# Patient Record
Sex: Female | Born: 1970 | Race: White | Hispanic: No
Health system: Midwestern US, Community
[De-identification: ages and names within clinical notes are randomized; demographics above are authoritative.]

## PROBLEM LIST (undated history)

## (undated) HISTORY — PX: ABDOMINAL HYSTERECTOMY: SHX81

## (undated) HISTORY — PX: TONSILLECTOMY: SUR1361

## (undated) HISTORY — PX: CHOLECYSTECTOMY: SHX55

---

## 2007-12-29 ENCOUNTER — Inpatient Hospital Stay: Admit: 2007-12-29 | Discharge: 2007-12-30

## 2017-07-14 NOTE — Procedures (Signed)
Breast Imaging - Right Ultrasound Biopsy        Patient:   [facility]BROWN[facility]            MRN: [id number]            FIN: [phone]               Age:   [age] years     Sex:  Female     DOB:  12/13/1970   Associated Diagnoses:   None   Author:   Dan MakerAHBAR-MD,  Lylla Eifler          Imaging procedure and order verification safety checklist completed: Yes  Allergies documented and available in allergy section of the EMR.    Procedure performed:   Right breast ultrasound guided biopsy    Target:   [x]  Mass   [ ]  Cyst    Puncture Site:   [ ]  Medial   [x]  Lateral   [ ]  Superior   [ ]  Inferior    Medications:   [x]  1% Lidocaine subcutaneous   [x]  1% Lidocaine with epinephrine    Specimens:   [x]  Core samples   [ ]  Cyst fluid    Estimated Blood Loss:   None.    Postoperative Diagnosis:   [ ]  Cyst aspiration performed   [x]  US core biopsy performed      Disposition:  Home    Comments: None.      Signature Line     Electronically Signed on 07/17/2017 11:17 AM EDT   ________________________________________________   Dan MakerAHBAR-MD, Sherril Heyward

## 2018-06-28 ENCOUNTER — Other Ambulatory Visit: Payer: Self-pay

## 2018-06-28 ENCOUNTER — Emergency Department (HOSPITAL_COMMUNITY): Payer: Self-pay

## 2018-06-28 ENCOUNTER — Encounter (HOSPITAL_COMMUNITY): Payer: Self-pay | Admitting: Emergency Medicine

## 2018-06-28 ENCOUNTER — Emergency Department (HOSPITAL_COMMUNITY)
Admission: EM | Admit: 2018-06-28 | Discharge: 2018-06-28 | Disposition: A | Discharge status: Home / Self Care | Payer: Self-pay | Attending: Emergency Medicine | Admitting: Emergency Medicine

## 2018-06-28 DIAGNOSIS — R1010 Upper abdominal pain, unspecified: Secondary | ICD-10-CM | POA: Insufficient documentation

## 2018-06-28 DIAGNOSIS — Z79899 Other long term (current) drug therapy: Secondary | ICD-10-CM | POA: Insufficient documentation

## 2018-06-28 DIAGNOSIS — R197 Diarrhea, unspecified: Secondary | ICD-10-CM | POA: Insufficient documentation

## 2018-06-28 DIAGNOSIS — F17210 Nicotine dependence, cigarettes, uncomplicated: Secondary | ICD-10-CM | POA: Insufficient documentation

## 2018-06-28 DIAGNOSIS — R112 Nausea with vomiting, unspecified: Secondary | ICD-10-CM | POA: Insufficient documentation

## 2018-06-28 LAB — COMPREHENSIVE METABOLIC PANEL
ALT: 19 U/L (ref 0–44)
AST: 21 U/L (ref 15–41)
Albumin: 4.6 g/dL (ref 3.5–5.0)
Alkaline Phosphatase: 70 U/L (ref 38–126)
Anion gap: 13 (ref 5–15)
BUN: 18 mg/dL (ref 6–20)
CO2: 21 mmol/L — ABNORMAL LOW (ref 22–32)
Calcium: 9.5 mg/dL (ref 8.9–10.3)
Chloride: 106 mmol/L (ref 98–111)
Creatinine, Ser: 0.77 mg/dL (ref 0.44–1.00)
GFR calc Af Amer: >60 mL/min (ref >60)
GFR calc non Af Amer: >60 mL/min (ref >60)
Glucose, Bld: 145 mg/dL — ABNORMAL HIGH (ref 70–99)
Potassium: 3.9 mmol/L (ref 3.5–5.1)
Sodium: 140 mmol/L (ref 135–145)
Total Bilirubin: 0.6 mg/dL (ref 0.3–1.2)
Total Protein: 7.4 g/dL (ref 6.5–8.1)

## 2018-06-28 LAB — LIPASE, BLOOD: Lipase: 27 U/L (ref 11–51)

## 2018-06-28 LAB — CBC WITH DIFFERENTIAL/PLATELET
Abs Immature Granulocytes: 0.03 10*3/uL (ref 0.00–0.07)
Basophils Absolute: 0 10*3/uL (ref 0.0–0.1)
Basophils Relative: 0 %
Eosinophils Absolute: 0 10*3/uL (ref 0.0–0.5)
Eosinophils Relative: 0 %
HCT: 43.6 % (ref 36.0–46.0)
Hemoglobin: 14.9 g/dL (ref 12.0–15.0)
Immature Granulocytes: 0 %
Lymphocytes Relative: 7 %
Lymphs Abs: 0.8 10*3/uL (ref 0.7–4.0)
MCH: 32.5 pg (ref 26.0–34.0)
MCHC: 34.2 g/dL (ref 30.0–36.0)
MCV: 95.2 fL (ref 80.0–100.0)
Monocytes Absolute: 0.2 10*3/uL (ref 0.1–1.0)
Monocytes Relative: 2 %
Neutro Abs: 9.3 10*3/uL — ABNORMAL HIGH (ref 1.7–7.7)
Neutrophils Relative %: 91 %
Platelets: 328 10*3/uL (ref 150–400)
RBC: 4.58 MIL/uL (ref 3.87–5.11)
RDW: 13 % (ref 11.5–15.5)
WBC: 10.3 10*3/uL (ref 4.0–10.5)
nRBC: 0 % (ref 0.0–0.2)

## 2018-06-28 LAB — URINALYSIS, ROUTINE W REFLEX MICROSCOPIC
Bilirubin Urine: NEGATIVE
Glucose, UA: NEGATIVE mg/dL
Ketones, ur: 80 mg/dL — AB
Leukocytes,Ua: NEGATIVE
Nitrite: NEGATIVE
Protein, ur: 30 mg/dL — AB
Specific Gravity, Urine: 1.026 (ref 1.005–1.030)
pH: 6 (ref 5.0–8.0)

## 2018-06-28 MED ORDER — IOHEXOL 300 MG/ML  SOLN
100.0000 mL | Freq: Once | INTRAMUSCULAR | Status: AC | PRN
  Administered 2018-06-28: 100 mL via INTRAVENOUS

## 2018-06-28 MED ORDER — SODIUM CHLORIDE 0.9 % IV BOLUS
1000.0000 mL | Freq: Once | INTRAVENOUS | Status: AC
  Administered 2018-06-28: 18:00:00 1000 mL via INTRAVENOUS

## 2018-06-28 MED ORDER — MORPHINE SULFATE (PF) 4 MG/ML IV SOLN
4.0000 mg | Freq: Once | INTRAVENOUS | Status: AC
  Administered 2018-06-28: 20:00:00 4 mg via INTRAVENOUS
  Filled 2018-06-28: qty 1

## 2018-06-28 MED ORDER — PROMETHAZINE HCL 25 MG/ML IJ SOLN
12.5000 mg | Freq: Once | INTRAMUSCULAR | Status: AC
  Administered 2018-06-28: 18:00:00 12.5 mg via INTRAVENOUS
  Filled 2018-06-28: qty 1

## 2018-06-28 NOTE — ED Provider Notes (Signed)
[facility] EMERGENCY DEPARTMENT Provider Note   CSN: [id number] Arrival date & time: 06/28/18  1725    History   Chief Complaint No chief complaint on file. emesis  HPI Debra Stewart is a 48 y.o. female with no significant past medical history presenting with constant upper abdominal pain onset last night. Patient reports symptoms started after having a pizza last night. Patient describes pain as sharp and states it is worse with movement. Patient states she has had multiple episodes of non bilious non bloody vomiting. Patient is unsure of the exact amount. Patient reports she has taken zofran without relief. Patient states she has had multiple episodes of [place] diarrhea. Patient denies any blood in stool. Patient states last BM was 1 hour ago. Patient states vomiting has improved, but continues to have nausea. Patient states she had a hysterectomy and a cholecystectomy. Patient reports she has had similar symptoms in the past. Patient reports occasional alcohol use, but denies any alcohol use today. Patient reports marijuana use and states last use was last night. Patient denies fever, chills, cough, shortness of breath, chest pain, sick contacts, or recent travel. Patient denies dysuria, hematuria, flank pain, or frequency.      HPI  History reviewed. No pertinent past medical history.  There are no active problems to display for this patient.   Past Surgical History:  Procedure Laterality Date   ABDOMINAL HYSTERECTOMY     CHOLECYSTECTOMY     TONSILLECTOMY       OB History   No obstetric history on file.      Home Medications    Prior to Admission medications   Not on File    Family History History reviewed. No pertinent family history.  Social History Social History   Tobacco Use   Smoking status: Current Every Day Smoker    Packs/day: 1.00    Types: Cigarettes   Smokeless tobacco: Never Used  Substance Use Topics   Alcohol use: Yes     Drug use: Yes    Frequency: 7.0 times per week    Types: Marijuana     Allergies   Patient has no known allergies.   Review of Systems Review of Systems  Constitutional: Negative for activity change, appetite change, chills, fever and unexpected weight change.  HENT: Negative for congestion, rhinorrhea and sore throat.   Eyes: Negative for visual disturbance.  Respiratory: Negative for cough and shortness of breath.   Cardiovascular: Negative for chest pain.  Gastrointestinal: Positive for abdominal pain, diarrhea, nausea and vomiting. Negative for constipation.  Endocrine: Negative for polydipsia, polyphagia and polyuria.  Genitourinary: Negative for dysuria, flank pain and frequency.  Musculoskeletal: Negative for back pain.  Skin: Negative for rash.  Allergic/Immunologic: Negative for immunocompromised state.  Psychiatric/Behavioral: The patient is not nervous/anxious.      Physical Exam Updated Vital Signs BP 131/69    Pulse (!) 51    Temp 98.2 F (36.8 C) (Oral)    Resp 16    Ht 5' (1.524 m)    Wt 65.8 kg    SpO2 100%    BMI 28.32 kg/m   Physical Exam Vitals signs and nursing note reviewed.  Constitutional:      General: She is not in acute distress.    Appearance: She is well-developed. She is not diaphoretic.  HENT:     Head: Normocephalic and atraumatic.     Mouth/Throat:     Mouth: Mucous membranes are dry.     Pharynx:  No posterior oropharyngeal erythema.  Neck:     Musculoskeletal: Normal range of motion and neck supple.  Cardiovascular:     Rate and Rhythm: Normal rate and regular rhythm.     Heart sounds: Normal heart sounds. No murmur. No friction rub. No gallop.   Pulmonary:     Effort: Pulmonary effort is normal. No respiratory distress.     Breath sounds: Normal breath sounds. No wheezing or rales.  Abdominal:     General: Bowel sounds are normal. There is no distension.     Palpations: Abdomen is soft. Abdomen is not rigid. There is no mass.      Tenderness: There is abdominal tenderness in the right upper quadrant, epigastric area and left upper quadrant. There is no right CVA tenderness, guarding or rebound.     Hernia: No hernia is present.  Musculoskeletal: Normal range of motion.  Skin:    Findings: No rash.  Neurological:     Mental Status: She is alert and oriented to person, place, and time.     ED Treatments / Results  Labs (all labs ordered are listed, but only abnormal results are displayed) Labs Reviewed  CBC WITH DIFFERENTIAL/PLATELET - Abnormal; Notable for the following components:      Result Value   Neutro Abs 9.3 (*)    All other components within normal limits  COMPREHENSIVE METABOLIC PANEL - Abnormal; Notable for the following components:   CO2 21 (*)    Glucose, Bld 145 (*)    All other components within normal limits  URINALYSIS, ROUTINE W REFLEX MICROSCOPIC - Abnormal; Notable for the following components:   APPearance HAZY (*)    Hgb urine dipstick MODERATE (*)    Ketones, ur 80 (*)    Protein, ur 30 (*)    Bacteria, UA RARE (*)    All other components within normal limits  URINE CULTURE  LIPASE, BLOOD    EKG None  Radiology Ct Abdomen Pelvis W Contrast  Result Date: 06/28/2018 CLINICAL DATA:  Abdominal pain with nausea and vomiting EXAM: CT ABDOMEN AND PELVIS WITH CONTRAST TECHNIQUE: Multidetector CT imaging of the abdomen and pelvis was performed using the standard protocol following bolus administration of intravenous contrast. CONTRAST:  100mL OMNIPAQUE IOHEXOL 300 MG/ML  SOLN COMPARISON:  None. FINDINGS: LOWER CHEST: There is no basilar pleural or apical pericardial effusion. HEPATOBILIARY: The hepatic contours and density are normal. There is no intra- or extrahepatic biliary dilatation. Status post cholecystectomy. PANCREAS: The pancreatic parenchymal contours are normal and there is no ductal dilatation. There is no peripancreatic fluid collection. SPLEEN: Normal. ADRENALS/URINARY  TRACT: --Adrenal glands: Normal. --Right kidney/ureter: No hydronephrosis, nephroureterolithiasis, perinephric stranding or solid renal mass. --Left kidney/ureter: No hydronephrosis, nephroureterolithiasis, perinephric stranding or solid renal mass. --Urinary bladder: Normal for degree of distention STOMACH/BOWEL: --Stomach/Duodenum: There is no hiatal hernia or other gastric abnormality. The duodenal course and caliber are normal. --Small bowel: No dilatation or inflammation. --Colon: No focal abnormality. --Appendix: Normal. VASCULAR/LYMPHATIC: Normal course and caliber of the major abdominal vessels. No abdominal or pelvic lymphadenopathy. REPRODUCTIVE: No free fluid in the pelvis. MUSCULOSKELETAL. 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 OTHER: None. IMPRESSION: No acute abnormality of the or abdomen pelvis. Electronically Signed   By: Deatra RobinsonKevin  Herman M.D.   On: 06/28/2018 20:27    Procedures Procedures (including critical care time)  Medications Ordered in ED Medications  promethazine (PHENERGAN) injection 12.5 mg (12.5 mg Intravenous Given 06/28/18 1806)  sodium chloride 0.9 % bolus 1,000 mL (0 mLs Intravenous Stopped 06/28/18 1945)  morphine 4 MG/ML injection 4 mg (4 mg Intravenous Given 06/28/18 1942)  iohexol (OMNIPAQUE) 300 MG/ML solution 100 mL (100 mLs Intravenous Contrast Given 06/28/18 2003)     Initial Impression / Assessment and Plan / ED Course  I have reviewed the triage vital signs and the nursing notes.  Pertinent labs & imaging results that were available during my care of the patient were reviewed by me and considered in my medical decision making (see chart for details).  Clinical Course as of Jun 27 2041  Thu Jun 28, 2018  1841 WBCs are within normal limits.   WBC: 10.3 [AH]  1841 Lipase is within normal limits.  Lipase, blood [AH]  1841 UA reveals moderate Hgb, ketones, protein, and rare bacteria. Negative for nitrite or  leukocytes.   Urinalysis, Routine w reflex microscopic(!) [AH]  1934 Upon reassessment, patient states nausea and vomiting have improved, but patient continues to endorse abdominal pain. Will order a CT abdomen.   [AH]  2033 No acute abnormality of the abdomen or pelvis.  CT Abdomen Pelvis W Contrast [AH]  2038 Patient reports symptoms have significantly improved. Patient states she feels comfortable with being discharged.    [AH]    Clinical Course User Index [AH] Leretha Dykes, PA-C      Patient presents with abdominal pain. Patient is nontoxic, nonseptic appearing, in no apparent distress.  Patient's symptoms adequately managed in emergency department.  Fluid bolus given.  Labs, imaging and vitals reviewed.  Patient continued to endorse abdominal pain on reassessment. CT abdomen reveals no acute abnormality in the abdomen or pelvis. On repeat exam patient does not have a surgical abdomin and there are no peritoneal signs.  Patient does not meet the SIRS or Sepsis criteria. No indication of appendicitis, bowel obstruction, bowel perforation, cholecystitis, diverticulitis, PID or ectopic pregnancy. Patient reports symptoms have improved while in the ER. Discussed marijuana use may exacerbate nausea and vomiting. Advised patient to stop using marijuana. Patient discharged home with symptomatic treatment and given strict instructions for follow-up with their primary care physician. Patient reports she has antiemetics at home and does not need another prescription at this time. I have also discussed reasons to return immediately to the ER.  Patient expresses understanding and agrees with plan.  Final Clinical Impressions(s) / ED Diagnoses   Final diagnoses:  Upper abdominal pain  Nausea vomiting and diarrhea    ED Discharge Orders    None       Glade Stanford 06/28/18 2043    Jacalyn Lefevre, MD 07/03/18 [phone]

## 2018-06-28 NOTE — ED Triage Notes (Signed)
Reports cholecystectomy 1.5 years ago.  Reports recently eating bad and has had vomiting since last night.  States the only thing that helps her is a "shot of phenergan and morphine".

## 2018-06-28 NOTE — Discharge Instructions (Signed)
You have been seen today for abdominal pain, nausea, vomiting, and diarrhea. Please read and follow all provided instructions.   1. Medications: usual home medications 2. Treatment: rest, drink plenty of fluids 3. Follow Up: Please follow up with your primary doctor in 2 days for discussion of your diagnoses and further evaluation after today's visit; if you do not have a primary care doctor use the resource guide provided to find one; Please return to the ER for any new or worsening symptoms. Please obtain all of your results from medical records or have your doctors office obtain the results - share them with your doctor - you should be seen at your doctors office. Call today to arrange your follow up.   Take medications as prescribed. Please review all of the medicines and only take them if you do not have an allergy to them. Return to the emergency room for worsening condition or new concerning symptoms. Follow up with your regular doctor. If you don't have a regular doctor use one of the numbers below to establish a primary care doctor.  Please be aware that if you are taking birth control pills, taking other prescriptions, ESPECIALLY ANTIBIOTICS may make the birth control ineffective - if this is the case, either do not engage in sexual activity or use alternative methods of birth control such as condoms until you have finished the medicine and your family doctor says it is OK to restart them. If you are on a blood thinner such as COUMADIN, be aware that any other medicine that you take may cause the coumadin to either work too much, or not enough - you should have your coumadin level rechecked in next 7 days if this is the case.  ?  It is also a possibility that you have an allergic reaction to any of the medicines that you have been prescribed - Everybody reacts differently to medications and while MOST people have no trouble with most medicines, you may have a reaction such as nausea, vomiting,  rash, swelling, shortness of breath. If this is the case, please stop taking the medicine immediately and contact your physician.  ?  You should return to the ER if you develop severe or worsening symptoms.   Emergency Department Resource Guide 1) Find a Doctor and Pay Out of Pocket Although you won't have to find out who is covered by your insurance plan, it is a good idea to ask around and get recommendations. You will then need to call the office and see if the doctor you have chosen will accept you as a new patient and what types of options they offer for patients who are self-pay. Some doctors offer discounts or will set up payment plans for their patients who do not have insurance, but you will need to ask so you aren't surprised when you get to your appointment.  2) Contact Your Local Health Department Not all health departments have doctors that can see patients for sick visits, but many do, so it is worth a call to see if yours does. If you don't know where your local health department is, you can check in your phone book. The CDC also has a tool to help you locate your state's health department, and many state websites also have listings of all of their local health departments.  3) Find a Walk-in Clinic If your illness is not likely to be very severe or complicated, you may want to try a walk in clinic. These are  popping up all over the country in pharmacies, drugstores, and shopping centers. They're usually staffed by nurse practitioners or physician assistants that have been trained to treat common illnesses and complaints. They're usually fairly quick and inexpensive. However, if you have serious medical issues or chronic medical problems, these are probably not your best option.  No Primary Care Doctor: Call [facility] Connect at  [phone] - they can help you locate a primary care doctor that  accepts your insurance, provides certain services, etc. Physician Referral Service-  [phone]  Chronic Pain Problems: Organization         Address  Phone   Notes  [facility]  [phone] Patients need to be referred by their primary care doctor.   Medication Assistance: Organization         Address  Phone   Notes  [facility] Medication [facility] [address] [place], [place] [address] [phone] --Must be a resident of [facility] -- Must have NO insurance coverage whatsoever (no Medicaid/ Medicare, etc.) -- The pt. MUST have a primary care doctor that directs their care regularly and follows them in the community   [facility]  [phone]   [employer]  [phone]    Agencies that provide inexpensive medical care: Organization         Address  Phone   Notes  [place]  [phone]   [facility]    [phone]   [facility] [place], [place] [address] [phone]   [place] [address], [place] [phone]   [facility]    [phone]   [facility]    [phone]   [place] [facility] [address], [place] Phone:  [phone], Fax:  [phone] Hours of Operation:  9 am - 6 pm, M-F.  Also accepts Medicaid/Medicare and self-pay.  [facility] for [place] [address], [address], [place] Phone: [phone], Fax: [phone]. Hours of Operation:  8:30 am - 5:30 pm, M-F.  Also accepts Medicaid and self-pay.  [facility] [address] [place] Phone: [phone]   [place], [place], [place] [phone], Ext. [phone] Mondays & Thursdays: 7-9 AM.  First 15 patients are seen on a first come, first serve basis.    [place] Providers:  Organization         Address  Phone   Notes  [facility] [address],  [place] [phone] Also accepts self-pay patients.  [facility] [address], [place]  [phone]   [place], [address], [place] [phone]   [facility] [address] [place] [phone]   Lucianne Lei [address], [place]   [phone] Only accepts [place] patients after they have their name applied to their card.   Self-Pay (no insurance) in [facility]:  Organization         Address  Phone   Notes  Sickle Cell Patients, [facility] [place] [phone]   [facility] [place] [phone]   [facility]  [address] [place]  [address], [place] [phone]   [facility]/Dr. Osei-Bonsu  [address], [place] or [address], [place] [phone] Phone number for both [place] and [place] locations is the same.  [facility] [address], [place] [phone]   [facility] [address] [place] or [address] [phone]   [facility] [address] [place] [phone], phone; [phone], fax Sees patients 1st and 3rd Saturday of every month.  Must not qualify for public or private insurance (i.e. Medicaid, Medicare, [place] Health Choice, Veterans' Benefits)  Household income should be no more than 200% of the poverty level The clinic cannot treat you if you are pregnant or think you are pregnant  Sexually transmitted diseases are not treated at the clinic.

## 2018-08-21 NOTE — ED Notes (Signed)
ED Patient Summary       ;          [facility]  [address], [place], [place] [address]  [id number]  Discharge Instructions (Patient)  _______________________________________     Lisa Spencer  DOB:  Sep 15, 1970                   MRN: [id number]                   FIN: ION%>[id number]  Reason For Visit: Fever; HEADACHE, THROAT AND LUNG PAIN  Final Diagnosis: Fever; Pneumonia; Vomiting     Visit Date: 08/21/2018 13:57:00  Address: [address] [place] [address]  Phone: [phone]     Primary Care Provider:      Name: Louellen Molder      Phone: [phone]        Emergency Department Providers:         Primary Physician:            [facility] would like to thank you for allowing [place] to assist you with your healthcare needs. The following includes patient education materials and information regarding your injury/illness.     Follow-up Instructions: You were treated today on an emergency basis, it may be wise to contact your primary care provider to notify them of your visit today. You may have been referred to your regular doctor or a specialist, please follow up as instructed. If your condition worsens or you can't get in to see the doctor, contact the Emergency Department.              With: Address: When:   NATHAN SHORES-MD [address] [place], [place] [address]  [phone] Business (1) Within 1 week       With: Address: When:   Follow up with primary care provider  Within 1 week              Printed Prescriptions:    Patient Education Materials:  Discharge Orders          Discharge Patient 08/21/18 15:30:00 EDT  Discharge Special Instructions You will be notified by phone if your test result is positive, but it may take up to 5 days. The quickest way to view or print your test result is by the Patient Portal. To begin your Patient Portal enrollment process, please visit [email or web address]  Discharge Special  Instructions Click on the "Sign up now" under [facility]'. If you need additional assistance on the [facility]' Patient Portal or need a copy of your COVID-19 lab test result, please call the [facility] Medical Records Office at [phone].         Comment:       COVID Discharge Instructions/Work Note (Custom) (Custom); Nausea and Vomiting, Adult, Easy-to-Read; Community-Acquired Pneumonia, Adult, Easy-to-Read                      COVID-19 Discharge Instructions  q Your COVID-19 lab test result is PENDING.   Your COVID-19 lab result is not available at this time. Please allow up to 5 days for processing and continue to self-quarantine at home for the next 14 days or unless you have a confirmed negative result (not detected). If your result is positive (detected) you will receive a phone call from [facility] [employer]. Please follow all other discharge instructions given to you by your healthcare provider.  q Your COVID-19 lab test result is POSITIVE.    Your COVID-19 lab result is positive (detected) for the COVID-19 infection. Please follow all other discharge instructions given to you by your healthcare provider.  You must self-quarantine at home for the next 14 days.  Minimize contact with others, including people that live in the same house.      q Your COVID-19 lab test result is NEGATIVE.   Your COVID-19 lab result is negative (not detected) for the COVID-19 infection. Please follow all other discharge instructions given to you by your healthcare provider    q Your COVID-19 antibody lab test is REACTIVE.   Your COVID-19 antibody lab result is reactive to the SARS-CoV-2 antibodies, the virus that causes the COVID-19 infection. A reactive result to the antibody test means that you have most likely had a previous COVID-19 infection but, DOES NOT mean that you have immunity from a future COVID-19 infection. Please follow all other discharge instructions given to you by your healthcare  provider. If you have any additional questions about your reactive antibody result call your 'Primary Care Physician'.     q Your COVID-19 antibody lab test is NON-REACTIVE.   Your COVID-19 antibody lab result is non-reactive to the SARS-CoV-2 antibodies, the virus that causes the COVID-19 infection. A non-reactive result to the antibody test means that you HAVE NOT   had a previous COVID-19 infection. Please follow all other discharge instructions given to you by your healthcare provider. If you have any additional questions about your non-reactive antibody result call your 'Primary Care Physician'.      ****The quickest way to view or print your own test result is by the 'Patient Portal' at [email or web address] see page 3 for further 'Patient Portal' details.    Note: In reviewing your COVID-19 result on the patient portal (under the 'results' tab) you may notice a variation in the test name that includes, "Coronavirus", "cNoV", "Corona", "SARS", or "COV2". These all relate to a COVID-19 test that you had performed at a [facility] location.         What does self-quarantine mean?   Avoid leaving the house unless seeking healthcare treatment. Things that you should NOT do include:  work, school, church, restaurants, and social gatherings   Be very careful not to share utensils, drinking glasses, towels, or bedding with other people   Wash/sanitize your hands frequently and avoid touching your face.   Frequently clean "high-touch" surfaces (e.g. counter tops, doorknobs, bathroom fixtures, phones, tablets, and keyboards).   Try to remain 6 feet away from others as much as possible.   Avoid sharing confined spaces with others as much as possible.  If you must share a confined space, consider wearing a face mask.    How Can I Treat My Symptoms?    With a viral infection, you may experience fever, cough, congestion, headache, sore throat, and body aches.  For symptoms, you may take  Tylenol and/or over-the-counter decongestants and cough medications.  Seek follow-up care if you develop worsening difficulty breathing. If you are returning for care, please call ahead to the ER or your doctor.  You may also see a doctor from home by going to [email or web address].        How to Access the [facility] Patient Portal  1. Go to: [email or web address]  2. You want the option of:  [facility]   If you do not have an account and are visiting the portal for the first  time, select "Sign up now".   If you already have an account, select "Log into [facility]".       3. If you are signing up for a new account you will fill out a "Self-Enrollment for [facility]" page in which you will securely enter your first, last name, date of birth, social security number, and an identity verification prompt. *Tip:  when filling out the "Self-Enrollment for [facility]" enter your name using the same spelling that is on file with your physician's office or hospital. Once this page is filled out, hit the purple "Next" button at the bottom of the page.   4. Verify your information and click the purple "Next Create Your Account" button.   5. After identity verification, you will create your username and password for your "[facility]" Patient Portal. You will then click the green "Create Account" button.   6. Once your account has been created, you will be taken back to a login screen. Log in with the username and password that you created in step 5.     ! If you need additional assistance with the [facility] Patient Portal, you may call the [facility] Records Office at [phone].                 Excuse from Work/School  __________________________________________________   needs to be excused from:    q Work  q School     beginning now and through the following date:   ___________________________.    Health Care Provider Name (printed):    ________________________________________     Health Care Provider (signature):   ____________________________________________    Date: _______________             Nausea and Vomiting    Nausea means you feel sick to your stomach. Throwing up (vomiting) is a reflex where stomach contents come out of your mouth.      HOME CARE     Take medicine as told by your doctor.     Do not force yourself to eat. However, you do need to drink fluids.     If you feel like eating, eat a normal diet as told by your doctor.    ? Eat rice, wheat, potatoes, bread, lean meats, yogurt, fruits, and vegetables.    ? Avoid high-fat foods.     Drink enough fluids to keep your pee (urine) clear or pale yellow.     Ask your doctor how to replace body fluid losses (rehydrate). Signs of body fluid loss (dehydration) include:    ? Feeling very thirsty.    ? Dry lips and mouth.    ? Feeling dizzy.     ? Dark pee.    ? Peeing less than normal.    ? Feeling confused.    ? Fast breathing or heart rate.    GET HELP RIGHT AWAY IF:     You have blood in your throw up.     You have black or bloody poop (stool).     You have a bad headache or stiff neck.     You feel confused.     You have bad belly (abdominal) pain.     You have chest pain or trouble breathing.     You do not pee at least once every 8 hours.     You have cold, clammy skin.     You keep throwing up after 24 to 48 hours.  You have a fever.    MAKE SURE YOU:     Understand these instructions.     Will watch your condition.     Will get help right away if you are not doing well or get worse.    This information is not intended to replace advice given to you by your health care provider. Make sure you discuss any questions you have with your health care provider.    Document Released: 07/13/2007 Document Revised: 04/18/2011 Document Reviewed: 09/30/2014  Elsevier Interactive Patient Education ?2016 Elsevier Inc.       Community-Acquired Pneumonia, Adult    Pneumonia is an infection of the  lungs. One type of pneumonia can happen while a person is in a hospital. A different type can happen when a person is not in a hospital (community-acquired pneumonia). It is easy for this kind to spread from person to person. It can spread to you if you breathe near an infected person who coughs or sneezes. Some symptoms include:     A dry cough.     A wet (productive) cough.     Fever.     Sweating.     Chest pain.      HOME CARE     Take over-the-counter and prescription medicines only as told by your doctor.    ? Only take cough medicine if you are losing sleep.    ? If you were prescribed an antibiotic medicine, take it as told by your doctor. Do not stop taking the antibiotic even if you start to feel better.     Sleep with your head and neck raised (elevated). You can do this by putting a few pillows under your head, or you can sleep in a recliner.     Do not use tobacco products. These include cigarettes, chewing tobacco, and e-cigarettes. If you need help quitting, ask your doctor.     Drink enough water to keep your pee (urine) clear or pale yellow.    A shot (vaccine) can help prevent pneumonia. Shots are often suggested for:     People older than 48 years of age.     People older than 48 years of age:    ? Who are having cancer treatment.    ? Who have long-term (chronic) lung disease.    ? Who have problems with their body's defense system (immune system).    You may also prevent pneumonia if you take these actions:     Get the flu (influenza) shot every year.     Go to the dentist as often as told.     Wash your hands often. If soap and water are not available, use hand sanitizer.    GET HELP IF:     You have a fever.     You lose sleep because your cough medicine does not help.    GET HELP RIGHT AWAY IF:     You are short of breath and it gets worse.     You have more chest pain.     Your sickness gets worse. This is very serious if:    ? You are an older adult.    ? Your body's defense system is  weak.     You cough up blood.    This information is not intended to replace advice given to you by your health care provider. Make sure you discuss any questions you have with your health care provider.  Document Released: 07/13/2007 Document Revised: 10/15/2014 Document Reviewed: 05/21/2014  Elsevier Interactive Patient Education ?2016 Elsevier Inc.         Allergy Info: No Known Allergies     Medication Information:  [facility] ED Physicians provided you with a complete list of medications post discharge, if you have been instructed to stop taking a medication please ensure you also follow up with this information to your Primary Care Physician.  Unless otherwise noted, patient will continue to take medications as prescribed prior to the Emergency Room visit.  Any specific questions regarding your chronic medications and dosages should be discussed with your physician(s) and pharmacist.          promethazine (Phenergan 12.5 mg rectal suppository) 1 Suppositories Per rectum (in the rectum) every 6 hours for 2 Days. Refills: 0.      Medications Administered During Visit:              Medication Dose Route   ondansetron 4 mg Oral   metoclopramide 10 mg Oral   promethazine 6.25 mg IM          Major Tests and Procedures:  The following procedures and tests were performed during your ED visit.  PROCEDURES%>  PROCEDURES COMMENTS%>          Laboratory Orders  Name Status Details   SARS COV2 (BD) Ordered Nasopharyngeal Swab, Stat, ST - Stat, 08/21/18 14:36:00 EDT, Once, 08/21/18 14:36:00 EDT, Nurse collect, Lucillie Garfinkel, Print label Y/N               Radiology Orders  Name Status Details   XR Chest 1 View Portable Completed 08/21/18 14:35:00 EDT, STAT 1 hour or less, Reason: Other abnormalities of breathing, Transport Mode: Portable, pp_set_radiology_subspecialty               Patient Care Orders  Name Status Details   COVID-19 Status Ordered 08/21/18 14:36:30 EDT, NOT VALID FOR  pharmacy, laboratory, radiology., 08/21/18 14:36:30 EDT, COVID-19 PUI - under investigation   Communication to Nursing Ordered 08/21/18 14:36:00 EDT, NOT VALID FOR pharmacy, laboratory, radiology., 08/21/18 14:36:00 EDT, 08/21/18 14:36:00 EDT   Discharge Patient Ordered 08/21/18 15:30:00 EDT   Discharge Special Instructions Ordered You will be notified by phone if your test result is positive, but it may take up to 5 days. The quickest way to view or print your test result is by the Patient Portal. To begin your Patient Portal enrollment process, please visit [email or web address]   Discharge Special Instructions Ordered Click on the "Sign up now" under [facility]'. If you need additional assistance on the [facility]' Patient Portal or need a copy of your COVID-19 lab test result, please call the [facility] Medical Records Office at [phone].   ED Assessment Adult Completed 08/21/18 14:07:51 EDT, 08/21/18 14:07:51 EDT   ED Secondary Triage Completed 08/21/18 14:07:51 EDT, 08/21/18 14:07:51 EDT   ED Triage Adult Completed 08/21/18 13:58:11 EDT, 08/21/18 13:58:11 EDT   Notify Provider Ordered 08/21/18 14:36:30 EDT, This message can only be seen by Nursing, it is not visible to Pharmacy, Laboratory, or Radiology., 08/21/18 14:36:30 EDT   Patient Isolation Ordered 08/21/18 14:36:00 EDT, Contact and Airborne, Constant Indicator       ---------------------------------------------------------------------------------------------------------------------  [facility]) encourages you to self-enroll in the [facility] Patient Portal.  [facility] Patient Portal will allow you to manage your personal health information securely from your own electronic device now and in the future.  To begin your Patient Portal enrollment process, please visit [email or web address] Click on "Sign up now" under [facility].  If you find that you need additional assistance on the [facility] Patient Portal or need a copy of your medical records, please call the [facility] Medical Records Office at [phone].  Comment:

## 2018-08-21 NOTE — ED Notes (Signed)
ED Triage Note       ED Secondary Triage Entered On:  08/21/2018 14:56 EDT    Performed On:  08/21/2018 14:55 EDT by Aline Brochure, RN, Manus Rudd               General Information   Barriers to Learning :   None evident   ED Home Meds Section :   Document assessment   [facility] ED Fall Risk Section :   Document assessment   ED History Section :   Document assessment   ED Advance Directives Section :   Document assessment   ED Palliative Screen :   N/A (prefilled for <65yo)   Lisa Spencer T - 08/21/2018 14:55 EDT   (As Of: 08/21/2018 14:56:12 EDT)   Problems(Active)    No Chronic Problems (Cerner  :NKP )  Name of Problem:   No Chronic Problems ; Recorder:   Aline Brochure RN, Manus Rudd; Code:   NKP ; Last Updated:   08/21/2018 14:56 EDT ; Life Cycle Date:   08/21/2018 ; Life Cycle Status:   Active ; Vocabulary:   Cerner          Diagnoses(Active)    Fever  Date:   08/21/2018 ; Diagnosis Type:   Reason For Visit ; Confirmation:   Complaint of ; Clinical Dx:   Fever ; Classification:   Medical ; Clinical Service:   Emergency medicine ; Code:   PNED ; Probability:   0 ; Diagnosis Code:   [id number]             -    Procedure History   (As Of: 08/21/2018 14:56:12 EDT)     [name] Fall Risk Assessment Tool   Hx of falling last 3 months ED Fall :   No   Patient confused or disoriented ED Fall :   No   Patient intoxicated or sedated ED Fall :   No   Patient impaired gait ED Fall :   No   Use a mobility assistance device ED Fall :   No   Patient altered elimination ED Fall :   No   [facility] ED Fall Score :   0    Lisa Spencer - 08/21/2018 14:55 EDT   ED Advance Directive   Advance Directive :   No   Aline Brochure RN, Manus Rudd - 08/21/2018 14:55 EDT   Social History   Social History   (As Of: 08/21/2018 14:56:12 EDT)   Tobacco:        Tobacco use: Never (less than 100 in lifetime).   (Last Updated: 08/21/2018 14:56:02 EDT by Aline Brochure, RN, Manus Rudd)            Med Hx   Medication List   (As Of: 08/21/2018  14:56:12 EDT)   No Known Home Medications     Lisa Spencer - 08/21/2018 14:55:56      Normal Order    ondansetron 4 mg Dis Tab  :   ondansetron 4 mg Dis Tab ; Status:   Completed ; Ordered As Mnemonic:   Zofran ODT ; Simple Display Line:   4 mg, 1 tabs, Oral, Once ; Ordering Provider:   Jerilynn Som; Catalog Code:   ondansetron ; Order Dt/Tm:   08/21/2018 14:35:53 EDT

## 2018-08-21 NOTE — ED Notes (Signed)
ED Triage Note       ED Triage Adult Entered On:  08/21/2018 14:07 EDT    Performed On:  08/21/2018 14:03 EDT by Donzetta KohutGRAVLEE, RN, MELISSA D               Triage   Chief Complaint :   Pt presents to the ed with complaints of fever, headache, cough and nausea since friday. Last dose of tylenol approx 1 hour pta   Numeric Rating Pain Scale :   6   [place] Mode of Arrival :   Walking   Infectious Disease Documentation :   Document assessment   Temperature Oral :   37.0 degC(Converted to: 98.6 degF)    Heart Rate Monitored :   64 bpm   Respiratory Rate :   20 br/min   Systolic Blood Pressure :   145 mmHg (HI)    Diastolic Blood Pressure :   96 mmHg (HI)    SpO2 :   98 %   Oxygen Therapy :   Room air   Patient presentation :   None of the above   Chief Complaint or Presentation suggest infection :   No   Dosing Weight Obtained By :   Measured   Weight Dosing :   68.7 kg(Converted to: 151 lb 7 oz)    Height :   153 cm(Converted to: 5 ft 0 in)    Body Mass Index Dosing :   29 kg/m2   GRAVLEE, RN, MELISSA D - 08/21/2018 14:03 EDT   Donzetta KohutGRAVLEE, RN, MELISSA D - 08/21/2018 14:03 EDT   DCP GENERIC CODE   Tracking Group :   ED [facility] [place] [employer]   JasperGRAVLEE, RN, [place]SA D - 08/21/2018 14:03 EDT   Tracking Acuity :   3   Romeo AppleHarrison, RN, Hale Droneshley T - 08/21/2018 15:14 EDT     ED General Section :   Document assessment   Pregnancy Status :   Patient denies   ED Allergies Section :   Document assessment   ED Reason for Visit Section :   Document assessment   GRAVLEE, RN, MELISSA D - 08/21/2018 14:03 EDT   ID Risk Screen Symptoms   Recent Travel History :   No recent travel   Close Contact with COVID-19 ID :   No   Last 14 days COVID-19 ID :   No   TB Symptom Screen :   Cough AND Fever OR Chills   C. diff Symptom/History ID :   Neither of the above   GRAVLEE, RN, MELISSA D - 08/21/2018 14:03 EDT   ID TB Screen   Hemoptysis (Blood in Sputum) :   No   Night Sweats :   No   Weight Loss Greater Than 10 Pounds :   No   Hx of TB Now or at Any Time in the  Past :   No   GRAVLEE, RN, MELISSA D - 08/21/2018 14:03 EDT   Allergies   (As Of: 08/21/2018 14:07:50 EDT)   Allergies (Active)   No Known Allergies  Estimated Onset Date:   Unspecified ; Created ByDonzetta Kohut:   GRAVLEE, RN, MELISSA D; Reaction Status:   Active ; Category:   Drug ; Substance:   No Known Allergies ; Type:   Allergy ; Updated By:   Donzetta KohutGRAVLEE, RN, MELISSA D; Reviewed Date:   08/21/2018 14:05 EDT        Psycho-Social   Last 3 mo, thoughts killing self/others :  Patient denies   Feels Unsafe at Home :   No   GRAVLEE, RN, MELISSA D - 08/21/2018 14:03 EDT   ED Reason for Visit   (As Of: 08/21/2018 14:07:50 EDT)   Diagnoses(Active)    Fever  Date:   08/21/2018 ; Diagnosis Type:   Reason For Visit ; Confirmation:   Complaint of ; Clinical Dx:   Fever ; Classification:   Medical ; Clinical Service:   Emergency medicine ; Code:   PNED ; Probability:   0 ; Diagnosis Code:   [id number]

## 2018-08-21 NOTE — ED Provider Notes (Signed)
Nausea-Vomiting *ED        Patient:   Lisa Spencer, [facility]            MRN: [id number]            FIN: [phone]               Age:   48 years     Sex:  Female     DOB:  Jan 04, 1971   Associated Diagnoses:   Pneumonia; Fever; Vomiting   Author:   Lucillie GarfinkelFORRESTER-PA-C,  Deshanae Lindo S      Basic Information   Time seen: Provider Seen (ST)   ED Provider/Time:    Lucillie GarfinkelFORRESTER-PA-C,  Jackelynn Hosie S / 08/21/2018 14:14  , Date & time 08/21/2018 14:15:00.   History source: Patient.   Arrival mode: Private vehicle.   History limitation: None.   Additional information: Chief Complaint from Nursing Triage Note   Chief Complaint  Chief Complaint: Pt presents to the ed with complaints of fever, headache, cough and nausea since friday. Last dose of tylenol approx 1 hour pta (08/21/18 14:03:00).      History of Present Illness   The patient presents with nausea and vomiting.  The onset was 5  days ago.  The course/duration of symptoms is fluctuating in intensity.  The character of symptoms is clear.  The degree at present is minimal.  The exacerbating factor is eating.  There are relieving factors including rest and remaining NPO.  Therapy today: none.  Associated symptoms: fever and Cough, headache.  Additional history: Patient presents for evaluation for subjective fevers, headache, cough, vomiting.  Patient states her symptoms started last Friday.  Patient states she does have a history of recurrent episodes of vomiting that she has had for several years.  Patient states that she vomited prior to my arrival to the room but the patient's emesis bag was empty.  Patient is in no acute distress otherwise at this time..        Review of Systems   Constitutional symptoms:  Fever, chills, generalized weakness.    Respiratory symptoms:  Cough.   Gastrointestinal symptoms:  Vomiting.   Neurologic symptoms:  Headache.             Additional review of systems information: All other systems reviewed and otherwise negative, All systems reviewed as documented in chart.       Health Status   Allergies:    Allergic Reactions (Selected)  No Known Allergies.      Past Medical/ Family/ Social History   Problem list:    Active Problems (1)  No Chronic Problems   , per nurse's notes.      Physical Examination               Vital Signs   Vital Signs   08/21/2018 14:56 EDT Respiratory Rate 20 br/min   08/21/2018 14:03 EDT Systolic Blood Pressure 145 mmHg  HI    Diastolic Blood Pressure 96 mmHg  HI    Temperature Oral 37.0 degC    Heart Rate Monitored 64 bpm    Respiratory Rate 20 br/min    SpO2 98 %   .   Measurements   08/21/2018 14:07 EDT Body Mass Index est meas 29.35 kg/m2    Body Mass Index Measured 29.35 kg/m2   08/21/2018 14:03 EDT Height/Length Measured 153 cm    Weight Dosing 68.7 kg   .   Basic Oxygen Information   08/21/2018 14:03 EDT SpO2 98 %  Oxygen Therapy Room air   .   General:  Alert, no acute distress.    Skin:  Warm, dry.    Head:  Normocephalic.   Neck:  Supple, no tenderness.    Eye:  Pupils are equal, round and reactive to light, extraocular movements are intact.    Ears, nose, mouth and throat:  Oral mucosa moist, no pharyngeal erythema or exudate.    Cardiovascular:  Regular rate and rhythm.   Respiratory:  Lungs are clear to auscultation, respirations are non-labored.    Chest wall:  No tenderness.   Back:  Nontender, Normal range of motion.    Musculoskeletal:  Normal ROM, normal strength.    Gastrointestinal:  Soft, Nontender, Non distended, Normal bowel sounds, No organomegaly.    Neurological:  Alert and oriented to person, place, time, and situation, No focal neurological deficit observed.    Lymphatics:  No lymphadenopathy.   Psychiatric:  Cooperative, appropriate mood & affect.       Medical Decision Making   Differential Diagnosis:  Nausea, vomiting, cough.    Rationale:  PA/NP reviewed with co-signing physician: any ECG, lab results, radiology studies, medication, diagnosis, and plan of care.   Documents reviewed:  Emergency department nurses' notes.   Radiology  results:  Rad Results (ST)   XR Chest 1 View Portable  ?  08/21/18 14:49:13  Chest AP: 08/21/18    INDICATION:Other abnormalities of breathing.    COMPARISON: None    FINDINGS: Subtle somewhat linear opacity is noted at the right lung base, most  likely representing atelectasis but difficult to exclude developing infiltrate.  Clinical correlation is advised. No overt pulmonary edema. No pleural effusion  or pneumothorax. Heart size is within normal limits. No acute osseous  abnormality. Prior rotator cuff repair.    IMPRESSION:    Subtle somewhat linear opacity at the right lung base which likely represents  atelectasis. Difficult to completely exclude developing infiltrate. Clinical  correlation and short interval follow-up recommended.  ?  Signed By: Annamaria HellingMCCARTHY-MD, MEREDITH N  , reviewed radiologist's report.    Notes:  I reviewed patient's past medical record, surgical records, and ED nurses note. , Patient was advised of all test results. Patient is to follow-up with their primary care physician for reevaluation current symptoms if no improvement in one week..       Reexamination/ Reevaluation   Course: improving.   Pain status: decreased.   Assessment: exam improved.   Notes: Patient advised the nurse that she is thrown up 4 times.  The patient's emesis bag is still empty and the patient is only been witnessed spitting.  Patient is requesting Phenergan and stating that that is the only thing that works for her.  I am somewhat concerned that the patient is drug-seeking at this time..      Impression and Plan   Diagnosis   Pneumonia (ICD10-CM J18.9, Discharge, Medical)   Fever (ICD10-CM R50.9, Discharge, Medical)   Vomiting (ICD10-CM R11.10, Discharge, Medical)   Plan   Condition: Improved, Stable.    Disposition: Discharged: to home.    Prescriptions: Launch prescriptions   Pharmacy:  Phenergan 12.5 mg rectal suppository (Prescribe): 12.5 mg, 1 supp, PR, q6hr, for 2 days, 8 supp, 0 Refill(s).    Patient was given  the following educational materials: Community-Acquired Pneumonia, Adult, Easy-to-Read, Nausea and Vomiting, Adult, Easy-to-Read,  COVID Discharge Instructions/Work Note (Custom) (Custom).    Follow up with: Follow up with primary care provider Within 1 week; [facility]  Within 1 week.    Counseled: Patient, Regarding diagnosis, Regarding diagnostic results, Regarding treatment plan, Regarding prescription, Patient indicated understanding of instructions.    Signature Line     Electronically Signed on 08/21/2018 10:52 PM EDT   ________________________________________________   Lucillie Garfinkel      Electronically Signed on 08/21/2018 11:10 PM EDT   ________________________________________________   Tommie Ard            Modified by: Lucillie Garfinkel on 08/21/2018 10:52 PM EDT

## 2018-08-21 NOTE — Discharge Summary (Signed)
ED Clinical Summary                         [facility]  [address]  [place], [place] [id number]  [phone]           PERSON INFORMATION  Name: Lisa Spencer, Lisa Spencer Age:  [age] Years DOB: Dec 19, 1970   Sex: Female Language: English PCP: Lisa Spencer,  Lisa Spencer   Marital Status:  Divorced Phone: [phone] Med Service: MED-Medicine   MRN:  [id number] Acct# [id number] Arrival: 08/21/2018 13:57:00   Visit Reason: Fever; HEADACHE, THROAT AND LUNG PAIN Acuity: 3 LOS: 000 02:27   Address:      [address] [place] [id number]  Diagnosis:      Fever; Pneumonia; Vomiting  Printed Prescriptions:            Allergies      No Known Allergies      Medications Administered During Visit:                  Medication Dose Route   ondansetron 4 mg Oral   metoclopramide 10 mg Oral   promethazine 6.25 mg IM       Patient Medication List:              promethazine (Phenergan 12.5 mg rectal suppository) 1 Suppositories Per rectum (in the rectum) every 6 hours for 2 Days. Refills: 0.         Major Tests and Procedures:  The following procedures and tests were performed during your ED visit.  COMMONPROCEDURES%>  COMMON PROCEDURESCOMMENTS%>          Laboratory Orders  Name Status Details   SARS COV2 (BD) Ordered Nasopharyngeal Swab, Stat, ST - Stat, 08/21/18 14:36:00 EDT, Once, 08/21/18 14:36:00 EDT, Nurse collect, Lucillie GarfinkelFORRESTER-PA-C,  PETER S, Print label Y/N               Radiology Orders  Name Status Details   XR Chest 1 View Portable Completed 08/21/18 14:35:00 EDT, STAT 1 hour or less, Reason: Other abnormalities of breathing, Transport Mode: Portable, pp_set_radiology_subspecialty               Patient Care Orders  Name Status Details   COVID-19 Status Ordered 08/21/18 14:36:30 EDT, NOT VALID FOR pharmacy, laboratory, radiology., 08/21/18 14:36:30 EDT, COVID-19 PUI - under investigation   Communication to Nursing Ordered 08/21/18 14:36:00 EDT, NOT VALID FOR pharmacy, laboratory, radiology.,  08/21/18 14:36:00 EDT, 08/21/18 14:36:00 EDT   Discharge Patient Ordered 08/21/18 15:30:00 EDT   Discharge Special Instructions Ordered You will be notified by phone if your test result is positive, but it may take up to 5 days. The quickest way to view or print your test result is by the Patient Portal. To begin your Patient Portal enrollment process, please visit [email or web address].   Discharge Special Instructions Ordered Click on the "Sign up now" under [facility]'. If you need additional assistance on the [facility]' Patient Portal or need a copy of your COVID-19 lab test result, please call the [facility] Medical Records Office at [phone].   ED Assessment Adult Completed 08/21/18 14:07:51 EDT, 08/21/18 14:07:51 EDT   ED Secondary Triage Completed 08/21/18 14:07:51 EDT, 08/21/18 14:07:51 EDT   ED Triage Adult Completed 08/21/18 13:58:11 EDT, 08/21/18 13:58:11 EDT   Notify Provider Ordered 08/21/18 14:36:30 EDT, This message can only be seen by Nursing, it is not visible to Pharmacy, Laboratory, or  Radiology., 08/21/18 14:36:30 EDT   Patient Isolation Ordered 08/21/18 14:36:00 EDT, Contact and Airborne, Constant Indicator             PROVIDER INFORMATION               Provider Role Assigned Lorelei Pont, RN, Manus Rudd ED Nurse 08/21/2018 14:13:26    Jerilynn Som ED MidLevel 08/21/2018 14:14:05        Attending Physician:  DEFAULT,  DOCTOR     Admit Doc  DEFAULT,  DOCTOR     Consulting Doc       VITALS INFORMATION  Vital Sign Triage Latest   Temp Oral ORAL_1%>37.0 degC ORAL%>37.0 degC   Temp Temporal TEMPORAL_1%> TEMPORAL%>   Temp Intravascular INTRAVASCULAR_1%> INTRAVASCULAR%>   Temp Axillary AXILLARY_1%> AXILLARY%>   Temp Rectal RECTAL_1%> RECTAL%>   02 Sat 98 % 98 %   Respiratory Rate RATE_1%>20 br/min RATE%>20 br/min   Peripheral Pulse Rate PULSE RATE_1%> PULSE RATE%>   Apical Heart Rate HEART RATE_1%> HEART RATE%>   Blood Pressure BLOOD PRESSURE_1%>/ BLOOD PRESSURE_1%>96  mmHg BLOOD PRESSURE%>145 mmHg / BLOOD PRESSURE%>96 mmHg                 Immunizations      No Immunizations Documented This Visit          DISCHARGE INFORMATION   Discharge Disposition: H Outpt-Sent Home   Discharge Location:    Home   Discharge Date and Time:    08/21/2018 16:24:03   ED Checkout Date and Time:    08/21/2018 16:24:03     DEPART REASON INCOMPLETE INFORMATION               Depart Action Incomplete Reason   Interactive View/I&O Recently assessed               Problems      Active           No Chronic Problems              Smoking Status      Never (less than 100 in lifetime)         PATIENT EDUCATION INFORMATION  Instructions:        COVID Discharge Instructions/Work Note (Custom) (Custom); Nausea and Vomiting, Adult, Easy-to-Read; Community-Acquired Pneumonia, Adult, Easy-to-Read     Follow up:                    With: Address: When:   [place] [address]  [phone]) [phone] Business (1) Within 1 week       With: Address: When:   Follow up with primary care provider  Within 1 week           ED PROVIDER DOCUMENTATION

## 2018-08-21 NOTE — Progress Notes (Signed)
Progress Note-Nurse               Unable to reach pt., due to non working number.  We will send letter with results in the mail.      Signature Line     Electronically Signed on 08/26/2018 06:08 PM EDT   ________________________________________________   Still, RN, Melody

## 2018-08-21 NOTE — ED Notes (Signed)
ED Patient Education Note     ;Patient Education Materials Follows:                      COVID-19 Discharge Instructions  q Your COVID-19 lab test result is PENDING.   Your COVID-19 lab result is not available at this time. Please allow up to 5 days for processing and continue to self-quarantine at home for the next 14 days or unless you have a confirmed negative result (not detected). If your result is positive (detected) you will receive a phone call from [facility]. Please follow all other discharge instructions given to you by your healthcare provider.       q Your COVID-19 lab test result is POSITIVE.    Your COVID-19 lab result is positive (detected) for the COVID-19 infection. Please follow all other discharge instructions given to you by your healthcare provider.  You must self-quarantine at home for the next 14 days.  Minimize contact with others, including people that live in the same house.      q Your COVID-19 lab test result is NEGATIVE.   Your COVID-19 lab result is negative (not detected) for the COVID-19 infection. Please follow all other discharge instructions given to you by your healthcare provider    q Your COVID-19 antibody lab test is REACTIVE.   Your COVID-19 antibody lab result is reactive to the SARS-CoV-2 antibodies, the virus that causes the COVID-19 infection. A reactive result to the antibody test means that you have most likely had a previous COVID-19 infection but, DOES NOT mean that you have immunity from a future COVID-19 infection. Please follow all other discharge instructions given to you by your healthcare provider. If you have any additional questions about your reactive antibody result call your 'Primary Care Physician'.     q Your COVID-19 antibody lab test is NON-REACTIVE.   Your COVID-19 antibody lab result is non-reactive to the SARS-CoV-2 antibodies, the virus that causes the COVID-19 infection. A non-reactive result to the antibody test means that you  HAVE NOT   had a previous COVID-19 infection. Please follow all other discharge instructions given to you by your healthcare provider. If you have any additional questions about your non-reactive antibody result call your 'Primary Care Physician'.      ****The quickest way to view or print your own test result is by the 'Patient Portal' at [email or web address] see page 3 for further 'Patient Portal' details.    Note: In reviewing your COVID-19 result on the patient portal (under the 'results' tab) you may notice a variation in the test name that includes, "Coronavirus", "cNoV", "Corona", "SARS", or "COV2". These all relate to a COVID-19 test that you had performed at a [place] location.         What does self-quarantine mean?   Avoid leaving the house unless seeking healthcare treatment. Things that you should NOT do include:  work, school, church, restaurants, and social gatherings   Be very careful not to share utensils, drinking glasses, towels, or bedding with other people   Wash/sanitize your hands frequently and avoid touching your face.   Frequently clean "high-touch" surfaces (e.g. counter tops, doorknobs, bathroom fixtures, phones, tablets, and keyboards).   Try to remain 6 feet away from others as much as possible.   Avoid sharing confined spaces with others as much as possible.  If you must share a confined space, consider wearing a face mask.    How  Can I Treat My Symptoms?    With a viral infection, you may experience fever, cough, congestion, headache, sore throat, and body aches.  For symptoms, you may take Tylenol and/or over-the-counter decongestants and cough medications.  Seek follow-up care if you develop worsening difficulty breathing. If you are returning for care, please call ahead to the ER or your doctor.  You may also see a doctor from home by going to [email or web address].        How to Access the [facility] Patient Portal  1. Go to:  [email or web address]  2. You want the option of:  [facility]   If you do not have an account and are visiting the portal for the first time, select "Sign up now".   If you already have an account, select "Log into [facility]".       3. If you are signing up for a new account you will fill out a "Self-Enrollment for [facility]" page in which you will securely enter your first, last name, date of birth, social security number, and an identity verification prompt. *Tip:  when filling out the "Self-Enrollment for [facility]" enter your name using the same spelling that is on file with your physician's office or hospital. Once this page is filled out, hit the purple "Next" button at the bottom of the page.   4. Verify your information and click the purple "Next Create Your Account" button.   5. After identity verification, you will create your username and password for your "[facility]" Patient Portal. You will then click the green "Create Account" button.   6. Once your account has been created, you will be taken back to a login screen. Log in with the username and password that you created in step 5.     ! If you need additional assistance with the [facility] Patient Portal, you may call the [facility] Medical Records Office at [phone].                 Excuse from Work/School  __________________________________________________   needs to be excused from:    q Work  q School     beginning now and through the following date:   ___________________________.    Health Care Provider Name (printed):   ________________________________________     Health Care Provider (signature):   ____________________________________________    Date: _______________            Easy-to-Read     Nausea and Vomiting    Nausea means you feel sick to your stomach. Throwing up (vomiting) is a reflex where stomach contents come out of your mouth.      HOME CARE     Take medicine as told  by your doctor.     Do not force yourself to eat. However, you do need to drink fluids.     If you feel like eating, eat a normal diet as told by your doctor.    ? Eat rice, wheat, potatoes, bread, lean meats, yogurt, fruits, and vegetables.    ? Avoid high-fat foods.     Drink enough fluids to keep your pee (urine) clear or pale yellow.     Ask your doctor how to replace body fluid losses (rehydrate). Signs of body fluid loss (dehydration) include:    ? Feeling very thirsty.    ? Dry lips and mouth.    ? Feeling dizzy.     ? Dark pee.    ? Peeing  less than normal.    ? Feeling confused.    ? Fast breathing or heart rate.    GET HELP RIGHT AWAY IF:     You have blood in your throw up.     You have black or bloody poop (stool).     You have a bad headache or stiff neck.     You feel confused.     You have bad belly (abdominal) pain.     You have chest pain or trouble breathing.     You do not pee at least once every 8 hours.     You have cold, clammy skin.     You keep throwing up after 24 to 48 hours.     You have a fever.    MAKE SURE YOU:     Understand these instructions.     Will watch your condition.     Will get help right away if you are not doing well or get worse.    This information is not intended to replace advice given to you by your health care provider. Make sure you discuss any questions you have with your health care provider.    Document Released: 07/13/2007 Document Revised: 04/18/2011 Document Reviewed: 09/30/2014  Elsevier Interactive Patient Education ?2016 Elsevier Inc.      Home Health Care     Community-Acquired Pneumonia, Adult    Pneumonia is an infection of the lungs. One type of pneumonia can happen while a person is in a hospital. A different type can happen when a person is not in a hospital (community-acquired pneumonia). It is easy for this kind to spread from person to person. It can spread to you if you breathe near an infected person who coughs or sneezes. Some symptoms include:      A dry cough.     A wet (productive) cough.     Fever.     Sweating.     Chest pain.      HOME CARE     Take over-the-counter and prescription medicines only as told by your doctor.    ? Only take cough medicine if you are losing sleep.    ? If you were prescribed an antibiotic medicine, take it as told by your doctor. Do not stop taking the antibiotic even if you start to feel better.     Sleep with your head and neck raised (elevated). You can do this by putting a few pillows under your head, or you can sleep in a recliner.     Do not use tobacco products. These include cigarettes, chewing tobacco, and e-cigarettes. If you need help quitting, ask your doctor.     Drink enough water to keep your pee (urine) clear or pale yellow.    A shot (vaccine) can help prevent pneumonia. Shots are often suggested for:     People older than 48 years of age.     People older than 48 years of age:    ? Who are having cancer treatment.    ? Who have long-term (chronic) lung disease.    ? Who have problems with their body's defense system (immune system).    You may also prevent pneumonia if you take these actions:     Get the flu (influenza) shot every year.     Go to the dentist as often as told.     Wash your hands often. If soap and water are not available, use hand sanitizer.  GET HELP IF:     You have a fever.     You lose sleep because your cough medicine does not help.    GET HELP RIGHT AWAY IF:     You are short of breath and it gets worse.     You have more chest pain.     Your sickness gets worse. This is very serious if:    ? You are an older adult.    ? Your body's defense system is weak.     You cough up blood.    This information is not intended to replace advice given to you by your health care provider. Make sure you discuss any questions you have with your health care provider.    Document Released: 07/13/2007 Document Revised: 10/15/2014 Document Reviewed: 05/21/2014  Elsevier Interactive Patient Education  ?2016 Elsevier Inc.

## 2018-08-25 LAB — COVID-19: CORONAVIRUS (COVID-19), NAA: NOT DETECTED

## 2020-07-01 IMAGING — CT CT ABDOMEN AND PELVIS WITH CONTRAST
2 of 5 series · 16 of 46 positions shown, 18 images · IV contrast (Omni 300)
Comparison: None.

CLINICAL DATA: Abdominal pain with nausea and vomiting

EXAM:
CT ABDOMEN AND PELVIS WITH CONTRAST
TECHNIQUE: Multidetector CT imaging of the abdomen and pelvis was performed
using the standard protocol following bolus administration of
intravenous contrast.
CONTRAST:  100mL OMNIPAQUE IOHEXOL 300 MG/ML  SOLN

[Series 3: a/p w/ 5mm · axial · 0.76mm/px · z∈[-372,+4]mm · 13 of 85 slices shown, 15 images]
[im 5/85  soft-tissue]
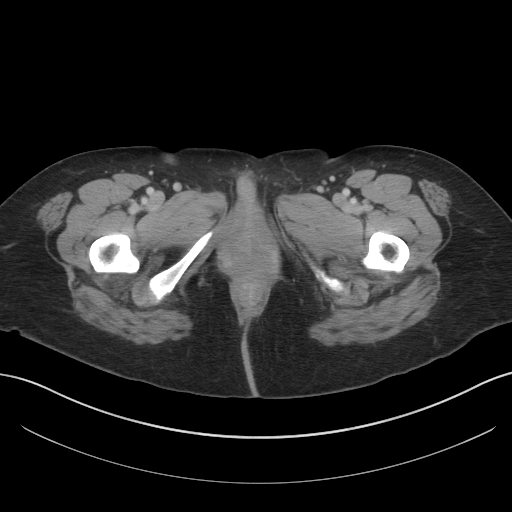
[im 5/85  bone]
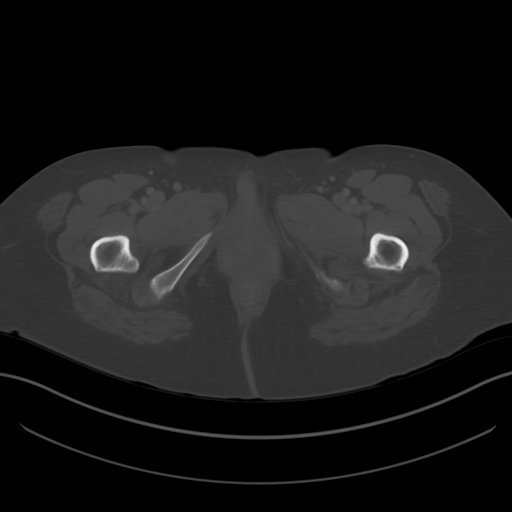
[im 10/85  soft-tissue]
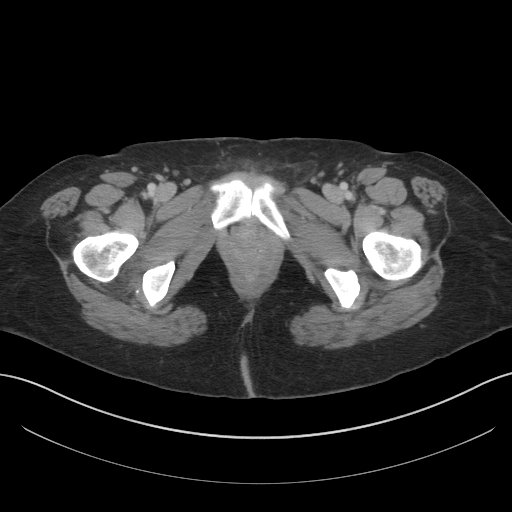
[im 19/85  soft-tissue]
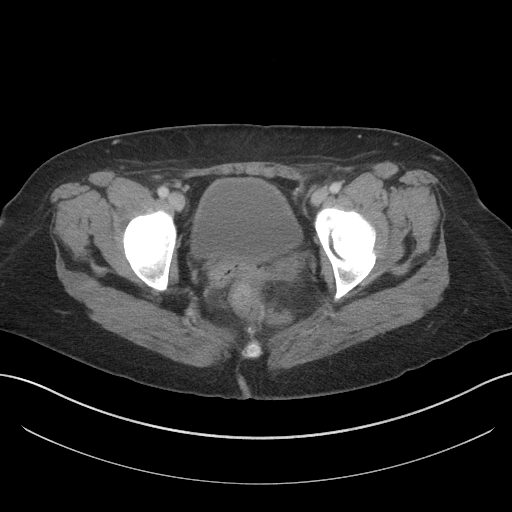
[im 24/85  soft-tissue]
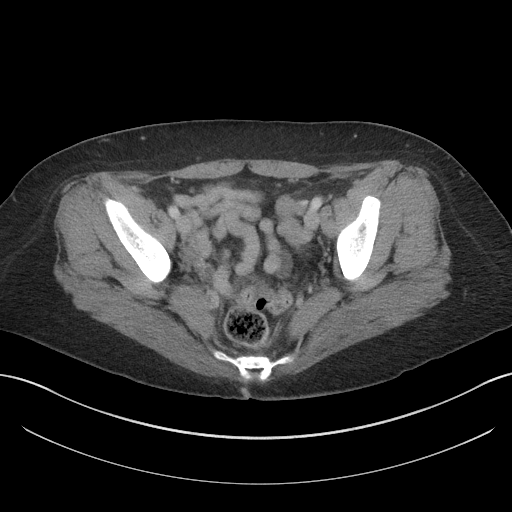
[im 29/85  soft-tissue]
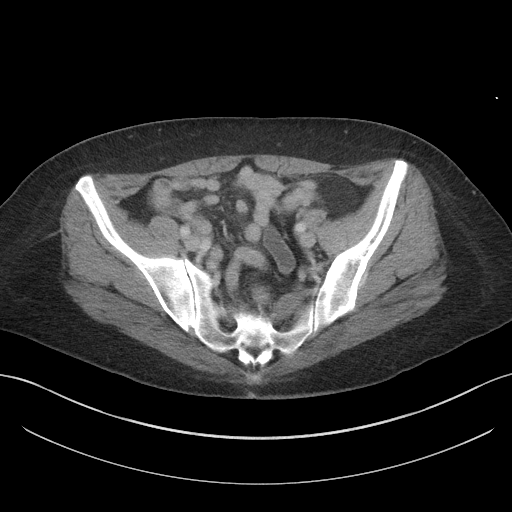
[im 38/85  soft-tissue]
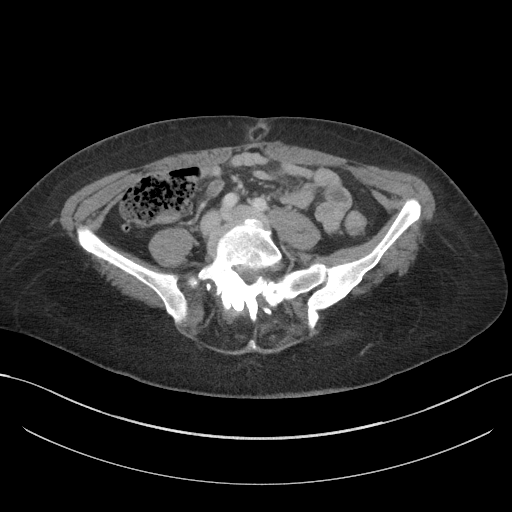
[im 43/85  soft-tissue]
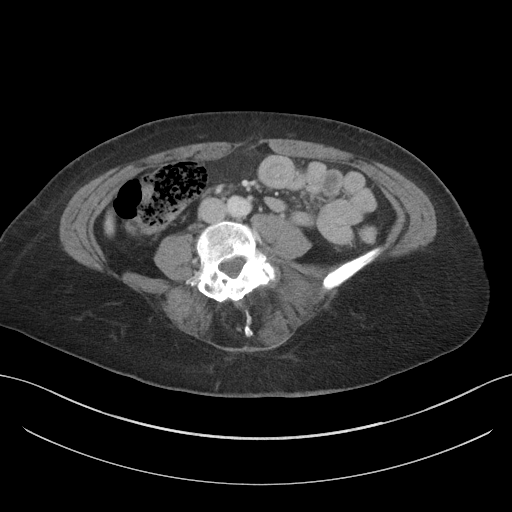
[im 47/85  soft-tissue]
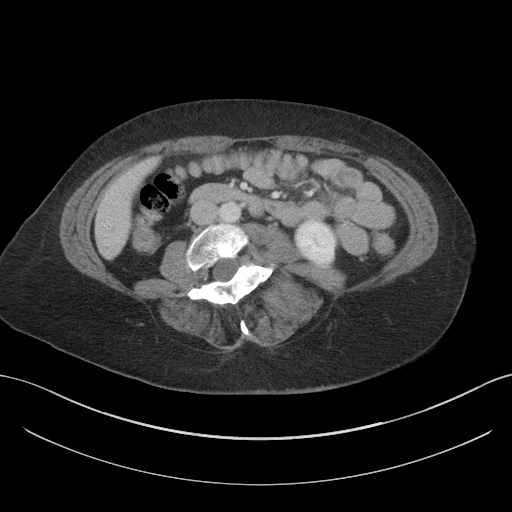
[im 57/85  soft-tissue]
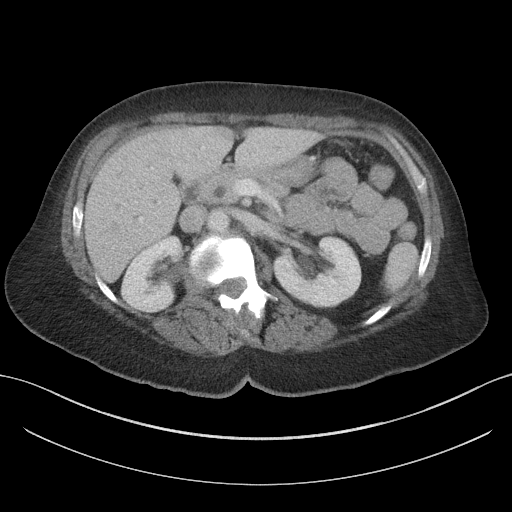
[im 57/85  bone]
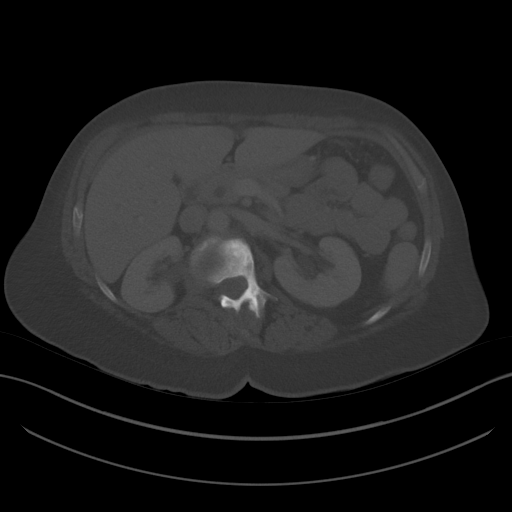
[im 61/85  soft-tissue]
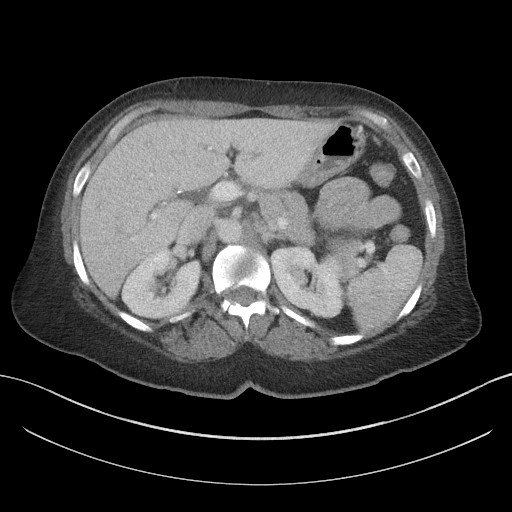
[im 66/85  soft-tissue]
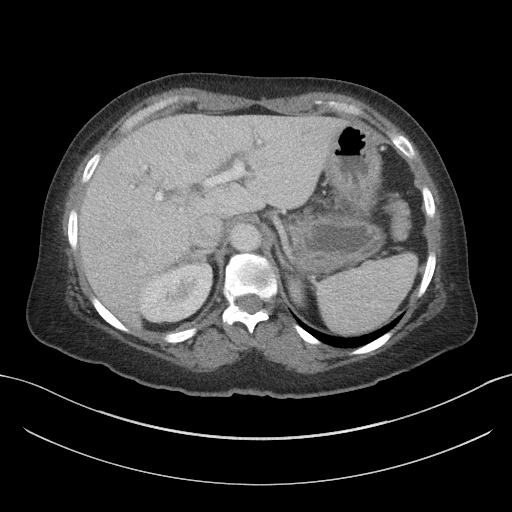
[im 75/85  soft-tissue]
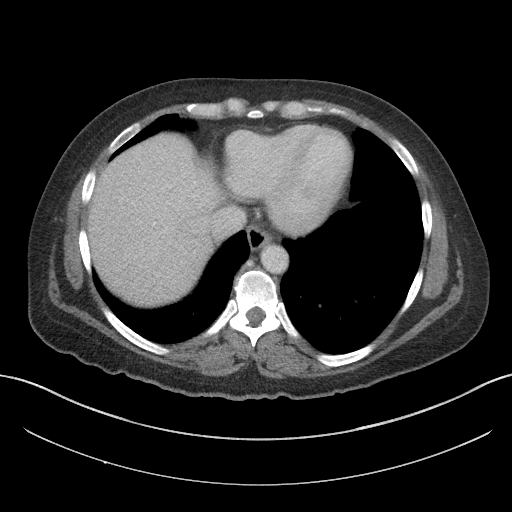
[im 80/85  soft-tissue]
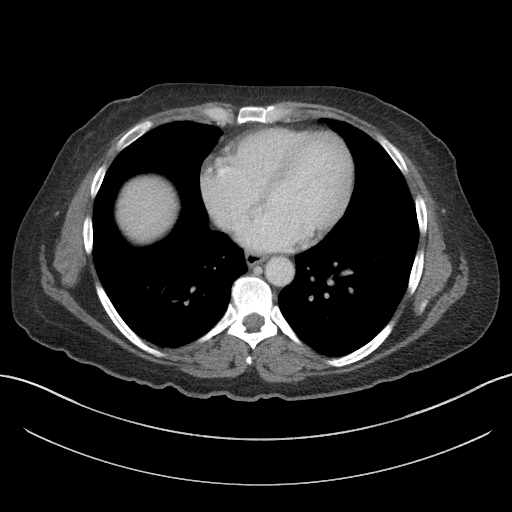

[Series 6: a/p w/ cor · coronal · 0.72mm/px · 3 of 120 slices shown]
[im 40/120  soft-tissue]
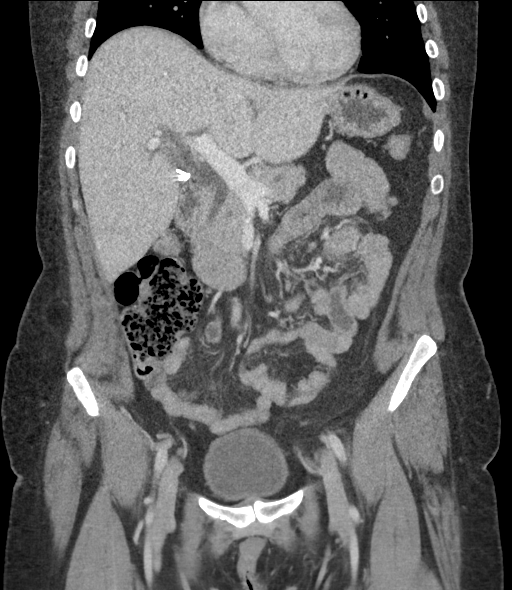
[im 53/120  soft-tissue]
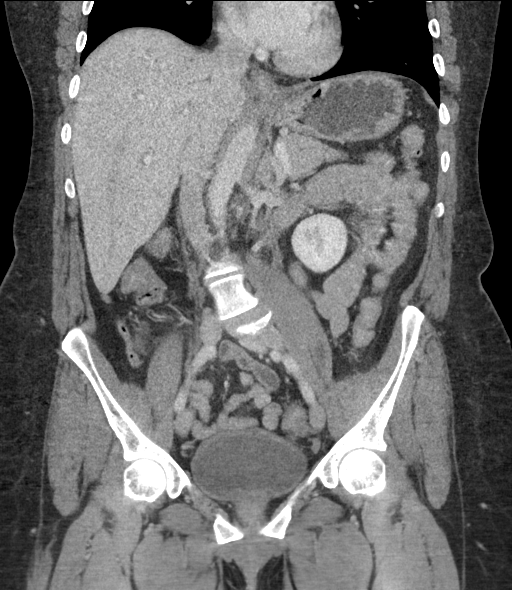
[im 67/120  soft-tissue]
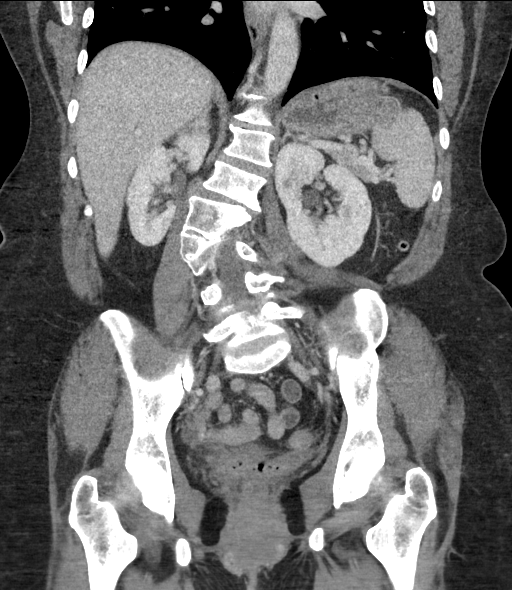

[16 of 46 positions shown; findings below may reference images not displayed]

FINDINGS: LOWER CHEST: There is no basilar pleural or apical pericardial
effusion.

HEPATOBILIARY: The hepatic contours and density are normal. There is
no intra- or extrahepatic biliary dilatation. Status post
cholecystectomy.

PANCREAS: The pancreatic parenchymal contours are normal and there
is no ductal dilatation. There is no peripancreatic fluid
collection.

SPLEEN: Normal.

ADRENALS/URINARY TRACT:

--Adrenal glands: Normal.

--Right kidney/ureter: No hydronephrosis, nephroureterolithiasis,
perinephric stranding or solid renal mass.

--Left kidney/ureter: No hydronephrosis, nephroureterolithiasis,
perinephric stranding or solid renal mass.

--Urinary bladder: Normal for degree of distention

STOMACH/BOWEL:

--Stomach/Duodenum: There is no hiatal hernia or other gastric
abnormality. The duodenal course and caliber are normal.

--Small bowel: No dilatation or inflammation.

--Colon: No focal abnormality.

--Appendix: Normal.

VASCULAR/LYMPHATIC: Normal course and caliber of the major abdominal
vessels. No abdominal or pelvic lymphadenopathy.

REPRODUCTIVE: No free fluid in the pelvis.

MUSCULOSKELETAL. There is right convex lumbar scoliosis with a
segmentation anomaly of the mid lumbar spine.

OTHER: None.
IMPRESSION: No acute abnormality of the or abdomen pelvis.

## 2022-06-20 ENCOUNTER — Encounter: Attending: Obstetrics & Gynecology | Primary: Internal Medicine

## 2022-07-11 NOTE — Other (Unsigned)
Subjective  Subjective:    Patient ID: Lisa Spencer  Age: 52 y.o. (DOB: September 28, 1970)  Ethnicity: Non-Hispanic  Race: White/Caucasian  Gender: female    Chief Complaint:  Chief Complaint  Patient presents with   Shoulder Pain    left    HPI    Medical HX New Patient  Who referred you to our office?: Dr.Godby  Which body part is involved: Shoulder  Which Side (Body Part): Left  Dominant Hand: Right  Was there an injury: No  When did symptoms start: years  Is it getting better or worse: Worse  Pain is where: Shoulder  Pain Description: Night Pain, Aching, Sharp  Pain Scale (1-10): 4  What makes symptoms better: Rest  Other symptoms (Where): Numbness, Tingling  Were you seen in the ER or Urgent Care for this problem: No  Have you had any treatment for this problem:  (tylenol, ibuprofen)  Have you had a problem like this before: No  Imaging done: MRI  Imaging where: [facility] Location  Do you take any blood thinners:  (none)  Any history of metal or jewelry allergy: No  Any history of surgery for a problem in this same area in the past: No  Brief Job Description: customer service  Have you missed any work for this problem or are you currently working with   any  restrictions: No  Greater than 68 years of age: No  Chronic pain with opioid use: No  Live > 1 hour from surgical care facility: No  Competent caregiver to provide post discharge care: No  Steps in or leading to home: No  Anticipate need for SNF or inpatient rehabilitation: No  Due to multiple comorbidities this patient is at higher risk for post   operative  complications that include but are not limited to infection, DVT, PE, cardiac  issues and require being monitored closely by medical personnel. : No  Medical HX FU  Greater than 90 years of age: No  Chronic pain with opioid use: No  Live > 1 hour from surgical care facility: No  Competent caregiver to provide post discharge care: No  Steps in or leading to home: No  Anticipate need for SNF or  inpatient rehabilitation: No  Due to multiple comorbidities this patient is at higher risk for post   operative  complications that include but are not limited to infection, DVT, PE, cardiac  issues and require being monitored closely by medical personnel. : No    Past Medical History:  Diagnosis Date   Alcohol abuse   Anxiety   Cancer (CMS HCC)   Depression   Graves disease   Osteoporosis   Scoliosis    Past Surgical History:  Procedure Laterality Date   HX HYSTERECTOMY   HX ROTATOR CUFF REPAIR   HX THYROIDECTOMY   HX TONSILLECTOMY   PR LAPAROSCOPY SURG CHOLECYSTECTOMY    No family history on file.  Current Outpatient Medications  Medication Sig Dispense Refill   acetaminophen (TYLENOL ARTHRITIS PO) Take by mouth.   diclofenac (VOLTAREN) 50 mg EC tablet Take 50 mg by mouth 2 times daily.   gabapentin (NEURONTIN) 300 mg capsule Take 300 mg by mouth 2 times daily.   levothyroxine (SYNTHROID) 75 mcg tablet Take 75 mcg by mouth daily.   tiZANidine (ZANAFLEX) 2 mg tablet Take 1-2 Tablets (2-4 mg) by mouth nightly   as  needed. 120 Tablet 0    No current facility-administered medications for this visit.  No Known Allergies  Social History    Socioeconomic History   Marital status: Divorced    Spouse name: Not on file   Number of children: Not on file   Years of education: Not on file   Highest education level: Not on file  Occupational History   Not on file  Tobacco Use   Smoking status: Every Day    Types: Vape Usage   Smokeless tobacco: Never  Substance and Sexual Activity   Alcohol use: Not Currently   Drug use: Not Currently   Sexual activity: Not on file  Other Topics Concern   Not on file  Social History Narrative   Not on file    Social Determinants of Health    Financial Resource Strain: Not on file  Food Insecurity: Not on file  Transportation Needs: Not on file  Physical Activity: Not on file  Stress: Not on file  Social Connections: Not on file  Intimate Partner Violence: Not on file  Housing Stability: Not  on file      ROS    General: No Contributing Conditions  Skin: No Contributing Conditions  ENT: No Contributing Conditions  Neck: No Contributing Conditions  Respiratory: No Contributing Conditions  Cardiovascular: No Contributing Conditions  Gastrointestinal: No Contributing Conditions  Genitourinary: No Contributing Conditions  Gynecological: No Contributing Conditions  Musculoskeletal: Left shoulder pain  Neurological: No Contributing Conditions  Psychological: No Contributing Conditions  Hematological: No Contributing Conditions  Ortho Exam  Physical Exam    Vitals:   07/11/22 1051  Weight: 214 lb (97.1 kg)  Height: 4\' 10"  (1.473 m)      PHYSICAL EXAM    VITALS:  Height 4\' 10"  (1.473 m), weight 214 lb (97.1 kg).  Constitutional: Well developed, Well nourished, No acute distress, Non-toxic  appearance.  Skin: Warm, Dry, No erythema, No rash.  Gait/Coordination: intact  Extremities: Intact distal pulses. No cyanosis, No clubbing.  Musculoskeletal: L shoudler 165 active ff, at 90/80/40, at 15, 30.  5-/5   abd/ff,  5/5 er/ir.  +impingement, +Obrien's.  +AC tenderness.  +cross adduction.    Tender  biceps.  Good belly press.  stable  Neurologic: Alert & oriented x 3, Normal motor function, Normal sensory  function, No focal deficits noted.    Imaging:  No images were ordered during this visit    EXAM: MRI-UPPER EXT JOINT W/O LT  MRI SHOULDER WITHOUT CONTRAST    INDICATION:  Chronic left shoulder pain    Comparison: None    Technique: Multiplanar, multisequence magnetic resonance imaging of the  left shoulder performed without contrast    Findings:    ROTATOR CUFF:  Moderate conjoined supraspinatus/infraspinatus tendinosis, moderate grade  interstitial tear at the tendon insertion with traction cyst formation and  traction edema at the tendon insertion onto the greater tuberosity.  Low-grade infraspinatus delamination.    Teres minor and subscapularis tendons intact.    No muscle atrophy.    ACROMIOCLAVICULAR  JOINT:  Mild acromioclavicular joint osteoarthritis. Mild subcortical cyst  formation. Small joint effusion. Low-grade capsular sprain.  Normal alignment.    ACROMION:  Type 2.    No  lateral downsloping.    SUBACROMIAL SUBDELTOID BURSA:  Trace fluid.    LONG HEAD BICEPS TENDON:  Moderate intra-articular biceps tendinosis. Biceps labral complex  degenerative tear. Mild biceps tenosynovitis.  Normal alignment in intertubercular groove.    LABRUM:  Degenerative biceps labral complex tear. SLAP tear, posterior superior,  posterior and posterior inferior labral tear  with adjacent para labral  cysts. Diminutive inferior labrum.    GLENOHUMERAL JOINT:  Mild glenohumeral joint osteoarthritis. Small osteophytes. Focal grade 4  inferior glenoid chondral fissure with subchondral marrow edema. Small  joint effusion with synovitis.    LIGAMENTS:  Rotator interval synovitis.  Inferior glenohumeral ligament intact.    BONES:  No fracture. No aggressive osseous lesion.    OTHER:  None.      Impression:    1.  Moderate grade interstitial tear at the conjoined tendon insertion  with tendinosis. Low-grade infraspinatus delamination.  2.  Mild acromioclavicular joint osteoarthritis. Trace  subacromial/subdeltoid bursal fluid.  3.  Mild glenohumeral joint osteoarthritis. Biceps labral complex tear,  near circumferential labral tear. Para labral cysts. Small joint effusion  with synovitis.      Assessment/Impression:    Encounter Diagnoses  Code Name Primary?   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, left shoulder      Plan:    Orders Placed This Encounter   Surgery    PROCEDURE: LEFT SHOULDER ARTHROSCOPY, rotator cuff debridement vs repair,  labral debridement vs repair, distal clavicle resection    LOCATION: [place]    ORDER TIME FRAME: Elective    TOTAL TIME NEEDED: 60 Mins    SIDE: Left    POSITION:  Lateral    OFFICE VISIT NEEDED: no    ADMIT TYPE: Same day surgery    X-RAY EQUIPMENT: None needed    SPECIAL EQUIPMENT: Arthrex anchors/Scorpion    POST-OPERATIVE EQUIPMENT: N/A    EQUIPMENT REP NEEDED: Protocol and Yes    ANESTHESIA: General/Block (Exparel)    ADDITIONAL LABS:    BLOOD TRANSFUSION: N/A    OTHER INSTRUCTIONS:    ANESTHESIA MD NEEDED: Yes    Had open R shoulder surgery "rotator cuff repair" about 6 years ago per   patient  report    Has done PT L shoulder  Worsening x months    MRI L shoulder personally reviewed and interpreted and c/w partial rotator   cuff  tear, labral tear,  AC arthropathy  Getting worse, affecting adls, quality of life    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  Discussed risks of surgery including but not limited to infection,  bleeding, nerve injury resulting in motor or sensory loss, blood clot/DVT,   RSD,  persistent pain, loss of motion, and need for further surgery. At no time were  any guarantees implied or stated. The patient acknowledged these risks and  electively reviewed and signed the consent form.  Education booklet given.    All  questions answered.  Will plan for L shoulder scope as above

## 2023-10-06 ENCOUNTER — Ambulatory Visit: Admit: 2023-10-06 | Discharge: 2023-10-06 | Payer: Medicaid (Managed Care) | Primary: Registered Nurse

## 2023-10-06 LAB — POCT COVID-19, ANTIGEN
Lot Number: 922959
SARS-COV-2, POC: NOT DETECTED

## 2023-10-06 LAB — POCT RAPID STREP A: Strep A Ag: NOT DETECTED

## 2023-10-06 MED ORDER — LORATADINE 10 MG PO TABS
10 | ORAL_TABLET | Freq: Every day | ORAL | 0 refills | 30.00000 days | Status: AC
Start: 2023-10-06 — End: ?

## 2023-10-06 MED ORDER — PROMETHAZINE-DM 6.25-15 MG/5ML PO SYRP
6.25-15 | Freq: Four times a day (QID) | ORAL | 0 refills | 4.00000 days | Status: AC | PRN
Start: 2023-10-06 — End: 2023-10-13

## 2023-10-06 MED ORDER — AMOXICILLIN-POT CLAVULANATE 875-125 MG PO TABS
875-125 | ORAL_TABLET | Freq: Two times a day (BID) | ORAL | 0 refills | 7.00000 days | Status: AC
Start: 2023-10-06 — End: 2023-10-13

## 2023-10-06 NOTE — Patient Instructions (Addendum)
 Take Augmentin  until finished.  Take Claritin  for the next two weeks.  Take Tylenol/ibuprofen as needed.   Continue to rest, stay hydrated, supportive care.  Thanks,   Rea Holt PA-C  [facility]gent Care Team

## 2023-10-06 NOTE — Progress Notes (Signed)
 Lisa Spencer (DOB: 10-21-1970) is a 53 y.o. female, New patient, here for evaluation of the following chief complaint(s):  Sinus Problem (Pt stated symptoms last weekend)    Visit date not found    ASSESSMENT/PLAN:    ICD-10-CM    1. Bacterial upper respiratory infection  J06.9 amoxicillin -clavulanate (AUGMENTIN ) 875-125 MG per tablet    B96.89       2. Left otitis media, unspecified otitis media type  H66.92 amoxicillin -clavulanate (AUGMENTIN ) 875-125 MG per tablet      3. Acute pharyngitis, unspecified etiology  J02.9 POCT rapid strep A     loratadine  (CLARITIN ) 10 MG tablet      4. Febrile illness, acute  R50.9 POCT COVID-19, Antigen        53 year old female is seen in the urgent care for for cough, sneezing, sore throat, HA, congestion x 1 week. States she has had subjective fevers at home, she cannot sleep because her cough is keeping her up, and she has left ear pain. On PE, the pt is coughing very loud and blowing her nose. Her left TM is erythematous and bulging. No rupture or drainage. She has bilateral cervical adenopathy with nasal congestion and rhinorrhea. Posterior oropharynx is erythematous without exudate. Lungs sound unremarkable. Pt is swabbed for Strep and COVID. Both tests were negative. She was dx with a left OM and Bacterial URI. Pt was treated with Augmentin . Promethazine  DM was prescribed for her acute cough and Claritin  to help alleviate some drainage. Pt is advised to hydrate and rest at this time.    Discussed PCP follow up for persisting or worsening symptoms, or to return to the clinic if unable to obtain PCP follow up for worsening symptoms.    The patient tolerated their visit well. The patient and/or the family were informed of the results of any tests, a time was given to answer questions, a plan was proposed and they agreed with plan. Reviewed AVS with treatment instructions and answered questions - pt/family expresses understanding and agreement with the discussed treatment  plan and AVS instructions.      SUBJECTIVE/OBJECTIVE:  Pt presents for cough, sneezing, sore throat, HA, congestion x 1 week. States she has had subjective fevers at home, she cannot sleep because her cough is keeping her up, and she has left ear pain. She just started a new job and the girl that is training her is sick. Pt has tried taking Mucinex over the counter without relief. Pt vapes.          VITAL SIGNS  Vitals:    10/06/23 1443   BP: 119/75   Pulse: 78   Temp: 98.7 F (37.1 C)   TempSrc: Oral   SpO2: 96%   Weight: 90.7 kg (200 lb)   Height: 1.499 m (4' 11)     Review of Systems   Constitutional:  Positive for fatigue and fever. Negative for chills.        Positive for decreased sleep   HENT:  Positive for congestion, postnasal drip, rhinorrhea, sinus pressure, sinus pain, sneezing, sore throat, trouble swallowing and voice change. Negative for ear discharge and ear pain.    Respiratory:  Positive for cough.    Neurological:  Positive for headaches.   All other systems reviewed and are negative.    Pertinent positives and negatives as above, or may be included within the HPI.    Physical Exam  Vitals and nursing note reviewed.   Constitutional:  Appearance: Normal appearance. She is obese.      Comments: Loud, wet cough heard on exam. Pt is blowing her nose.   HENT:      Head: Normocephalic and atraumatic.      Right Ear: Tympanic membrane, ear canal and external ear normal.      Left Ear: External ear normal.      Ears:      Comments: Left TM is bulging with erythema. No rupture or drainage.     Nose: Congestion and rhinorrhea present.      Mouth/Throat:      Pharynx: Posterior oropharyngeal erythema present. No oropharyngeal exudate.   Eyes:      Extraocular Movements: Extraocular movements intact.      Conjunctiva/sclera: Conjunctivae normal.      Pupils: Pupils are equal, round, and reactive to light.      Comments: Bilateral lids appear edematous    Cardiovascular:      Rate and Rhythm: Normal  rate and regular rhythm.      Pulses: Normal pulses.      Heart sounds: Normal heart sounds.   Pulmonary:      Effort: Pulmonary effort is normal. No respiratory distress.      Breath sounds: Normal breath sounds. No wheezing.   Musculoskeletal:         General: Normal range of motion.      Cervical back: Normal range of motion and neck supple.   Lymphadenopathy:      Cervical: Cervical adenopathy present.   Skin:     General: Skin is warm and dry.      Findings: No rash.   Neurological:      Mental Status: She is alert.   Psychiatric:         Mood and Affect: Mood normal.         Behavior: Behavior normal.       PROCEDURES:  Unless otherwise noted below, none     Procedures    RESULTS:  Results for orders placed or performed in visit on 10/06/23   POCT rapid strep A   Result Value Ref Range    Strep A Ag None Detected None Detected   POCT COVID-19, Antigen   Result Value Ref Range    SARS-COV-2, POC Not-Detected Not Detected    Lot Number [id number]     QC Pass/Fail pass     Performing Instrument [email or web address]      An electronic signature was used to authenticate this note.    --Rea Holt, PA-C
